# Patient Record
Sex: Male | Born: 1955 | Race: White | Hispanic: No | Marital: Single | Smoking: Current every day smoker
Health system: Southern US, Community
[De-identification: ages and names within clinical notes are randomized; demographics above are authoritative.]

## PROBLEM LIST (undated history)

## (undated) DIAGNOSIS — I219 Acute myocardial infarction, unspecified: Secondary | ICD-10-CM

## (undated) DIAGNOSIS — I10 Essential (primary) hypertension: Secondary | ICD-10-CM

---

## 2006-05-06 HISTORY — PX: CORONARY ANGIOPLASTY WITH STENT PLACEMENT: SHX49

## 2008-02-08 ENCOUNTER — Inpatient Hospital Stay (HOSPITAL_COMMUNITY): Admission: EM | Admit: 2008-02-08 | Discharge: 2008-02-10 | Payer: Self-pay | Admitting: Emergency Medicine

## 2008-02-10 ENCOUNTER — Encounter (INDEPENDENT_AMBULATORY_CARE_PROVIDER_SITE_OTHER): Payer: Self-pay | Admitting: Cardiovascular Disease

## 2010-09-18 NOTE — Discharge Summary (Signed)
NAME:  Jack Foster, Jack Foster NO.:  [id number]   MEDICAL RECORD NO.:  [id number]          PATIENT TYPE:  INP   LOCATION:  3702                         FACILITY:  [facility]   PHYSICIAN:  Nanetta Batty, M.D.   DATE OF BIRTH:  June 21, 1955   DATE OF ADMISSION:  02/08/2008  DATE OF DISCHARGE:  02/10/2008                               DISCHARGE SUMMARY   DISCHARGE DIAGNOSES:  1. Acute non-ST-elevation myocardial infarction.  2. Coronary artery disease with 80% left anterior descending stenosis      undergoing percutaneous transluminal coronary angioplasty and stent      deployment with a Driver stent.  3. Mild decrease in ejection fraction 40% with moderate hypokinesis.  4. Significantly abnormal EKG.  5. Left ventricular hypertrophy on 2D echo.  6. Hypertension, poorly controlled.  7. Hyperlipidemia, now on medication.  8. Occasional bradycardia with beta blocker.  9. Chronic tobacco use, instructed to stop.  10.Hepatitis C, has not had treatment.   DISCHARGE CONDITION:  Improved.   PROCEDURES:  Urgent combined left heart catheterization on February 08, 2008, by Dr. Nanetta Batty.   February 08, 2008, percutaneous transluminal coronary angioplasty and  stent deployment to the left anterior descending stenosis, reducing 80%  to 0% stenosis.   DISCHARGE MEDICATIONS:  Please note, the patient was not on any  medications except p.r.n. Aleve or Pepcid prior to admission.  1. Plavix 75 mg 1 daily, do not stop could cause a heart attack.  2. Aspirin 325 mg daily.  3. Lopressor/metoprolol 50 mg one-half tablet twice a day to equal 25      mg twice a day.  4. Lisinopril 20 mg daily.  5. Simvastatin 40 mg every evening.  6. Zantac 150 mg twice a day for reflux, though previous Pepcid at      home he may complete that and then go to Zantac as it is cheaper.  7. Nitroglycerin 1/150th one under the tongue while sitting as needed      for chest pain, 1 every 5 minutes up to 3 over 15  minutes, if chest      pain continues, call 911.  8. Ativan 0.5 mg 1 every 6 hours as needed for anxiety to help prevent      tobacco use.   Decrease alcohol intake to help protect his stomach and prevent  bleeding.   We will schedule gastroenterologist appointment either in [place] or  [place] to assess hepatitis C.   DISCHARGE INSTRUCTIONS:  1. The patient is currently unemployed.  It was instructed, he may      shower or bathe, increase activity slowly, no lifting for 1 week,      no driving for 2 days.  2. Low-sodium, heart-healthy diet.  3. Wash cath site with soap and water.  Call if any bleeding,      swelling, or drainage.  4. Follow up with Dr. Allyson Sabal, Wednesday, February 17, 2008, at 2:45 p.m.  5. He does not have primary care.  We are referring him to [facility] in [place].  He  should call them when he gets home to set      up an appointment.   Stop using Aleve and prefer Pepcid or Zantac.   HISTORY OF PRESENT ILLNESS:  A 55 year old white divorced male with no  prior cardiac history and only known medical history was for hepatitis C  and complaints of gastroesophageal reflux disease.  The patient was seen  in the emergency room on February 08, 2008, after presenting with chest  pain.  He had had some chest pressure, squeezing, tightness with  diaphoresis, shortness of breath, nausea, and vomiting on that day he  was admitted with abnormal EKG.  He had been given 4 baby aspirin, 1  nitro sublingual with relief.  When he was seen in the emergency room,  he did have EKG changes in anterolateral leads but was pain free.   The patient not had any chest discomfort until Saturday prior to  admission, 1 episode of chest pressure 10-15 minutes, went away with  rest.  On the next day the day prior to admission, 3 episodes of chest  pain pressure, each lasted 15-20 minutes with similar symptoms of  shortness of breath and diaphoresis and that all only occurred in  the  morning and by February 08, 2008, his pain returned, and he realized he  should get some help with it.  Here in the emergency room, his blood  pressure was very elevated as well.   PAST MEDICAL HISTORY:  Untreated hepatitis C.  He had not seen his  physician in some time and known history.   FAMILY HISTORY:  Father and brother in their early 32s with MI.  One  brother with bypass.  Mother does not have coronary disease.  His sister  who was with him in the emergency room is alive and well without  coronary disease.   SOCIAL HISTORY:  Divorced, unemployed Corporate investment banker, and that I do  not believe he drives.  He walks 5 miles at times and rides a bike  locally to get what he needs done.  He does smoke one and a half packs  per day of tobacco and has from numerous years.  He also drinks 1-2, 12  packs a week of beer.  He has also noticed increased weight over the  last several months and has not exercised.  Eats pizza, fried foods,  basically, fast foods.   PHYSICAL EXAMINATION:  At discharge, blood pressure 166/84, pulse 59,  respirations 20, temperature 96.7, and room air oxygen sat 99%.  Blood  blood pressure was fluctuating 129/59 to 166/84.  On exam, alert and  oriented white male.  Lungs were clear without rales, rhonchi, or  wheezes.  Heart sounds S1 and S2, regular rate and rhythm without  murmur, gallop, rub, or click.  Right groin cath site was stable without  hematoma.   LABORATORY DATA:  Hemoglobin 16.4, hematocrit 48.1, WBC 9.1, platelets  167, and neutrophils 76.  These remained stable.  Chemistry, sodium 137,  potassium 5.4 on admission, but it quickly dropped to 3.9, chloride 108,  CO2 23, BUN 11, creatinine 0.84, and glucose 121.  Glucose was mildly  elevated during hospitalization but glycohemoglobin was negative.  Potassium as well as basic metabolic panel remained stable throughout  hospital stay.  Pro time on admission 13, INR of 1.0, PTT 26.  AST on   admission was 58, ALT 51, alkaline phosphatase 58, total bilirubin 0.5,  albumin 3.7.  Follow up prior to discharge,  AST 52, ALT 56, alkaline  phos 69, total bilirubin 1 and albumin 3.9.   Cardiac enzymes, initial CK was 191 with MB of 5.2, troponin I of 0.23.  Second set, CK 162, MB 5.6, and troponin 0.27 and then followup CK 140,  MB 4.4, and troponin 0.13 and prior to discharge troponin was negative,  CK was 169 but MB was 2.   Total cholesterol 217, LDL 157, HDL 36, triglycerides 121.  Calcium was  normal 8.9, magnesium 1.9.  Hepatitis C antibodies reactive.  TSH was  1.441  and glycohemoglobin was 5.1.  Chest x-ray on admission, no acute  infiltrate or pleural effusion or pulmonary edema.  Left basilar  atelectasis noted.   HOSPITAL COURSE:  Mr. Hopping was admitted on February 08, 2008, by Dr.  Allyson Sabal after presenting with chest pain, nausea, vomiting, shortness of  breath, and diaphoresis.  He had abnormal EKG suggesting non-ST  elevation anterolateral MI.  He was taken to the cath lab more urgently  as table was ready in the lab and was found to have an 80% LAD stenosis.  Underwent PTCA and stent deployment.   The patient was originally placed on heparin, Integrilin IV, as well as  IV nitroglycerin.  Blood pressure medications were added for his  uncontrolled hypertension, and he was placed on Ativan 0.5 every 6 hours  during hospital stay secondary to tobacco use and alcohol use.  By the  next day, he was stable without further complaints.  Tobacco Cessation  saw him, and he preferred to stop cold [place].   The patient is unemployed.  He was given prescriptions that he could  have filled at [facility] for 4 dollars and for Plavix, I gave him 2 weeks  of card free and then prescription and we will try and get medications  from the [facility].  By February 10, 2008, he was stable as  cardiac enzymes continued to decrease and it was felt he was ready for  discharge home.  He  ambulated without problems, though his blood  pressure continued to creep up.  Occasionally, his Lopressor had been  held secondary to bradycardia, but he did not complain of any  lightheadedness or dizziness.  We will send him for GI for hepatitis C.  Also the case manager saw him.  We will send him to assess the security  office to apply for Medicaid and for primary care to the [facility] in  [place].  The patient was stable at the time of discharge and will  follow up with Dr. Allyson Sabal as an outpatient.      Darcella Gasman. Annie Paras, N.P.      Nanetta Batty, M.D.  Electronically Signed    LRI/MEDQ  D:  02/10/2008  T:  02/11/2008  Job:  [id number]

## 2010-09-18 NOTE — Cardiovascular Report (Signed)
Jack, Foster NO.:  [id number]   MEDICAL RECORD NO.:  [id number]          PATIENT TYPE:  INP   LOCATION:  2912                         FACILITY:  [facility]   PHYSICIAN:  Nanetta Batty, M.D.   DATE OF BIRTH:  01-11-1956   DATE OF PROCEDURE:  02/08/2008  DATE OF DISCHARGE:                            CARDIAC CATHETERIZATION   Jack Foster is a 55 year old divorced white male father of one,  grandfather of one grandchild with multiple cardiac risk factors  including strong family history and ongoing tobacco abuse, who has had 3  days unstable angina, worse today.  He presented to [facility]  where he was found to have anterolateral T-wave inversion.  His pain  subsided with aspirin and IV nitro.  Bolused with heparin and  Integrilin, was brought to the cardiac catheterization laboratory for  urgent cardiac catheterization to define its anatomy and to rule out an  ischemic etiology.   PROCEDURE DESCRIPTION:  The patient was brought to the second floor  [facility] in a postabsorptive state.  Premedicated with  p.o. Valium, IV Versed and fentanyl.  His right groin was prepped and  shaved in the usual sterile fashion.  A 1% Xylocaine was used for local  anesthesia.  A 6-French sheath was inserted into the right femoral  artery using standard Seldinger technique.  A 6-French right and left  Judkins diagnostic catheter as well as a 6-French pigtail catheter were  used for selective cholangiography, left ventriculography, and distal  abdominal aortography.  Visipaque dye was used for the entirety of the  case.  Retrograde aortic, ventricular, and pull-down pressures were  recorded.   HEMODYNAMICS:  Aortic systolic pressure 120 and diastolic pressure is  62.   Ventricle systolic pressure 124, end-diastolic pressure 6.   SELECTIVE CHOLANGIOGRAPHY:  1. Left main normal.  2. LAD; LAD had a shelf-like lesion of right after the first small  diagonal and septal perforator branch.  The remainder of the LAD is      free of significant disease.  3. Left Circumflex; nondominant and free of significant disease.  4. Right coronary; dominant and free of significant disease.  5. Left ventriculography; RAO ventriculogram was performed using 25 mL      of Visipaque dye at 12 mL per second.  The overall LVEF was      estimated between 40-45% with moderate anteroapical hypokinesia      confirming the proximal LAD lesion significance.  6. Distal abdominal aortography; distal abdominal aortogram was      performed using 20 mL of Visipaque dye at 20 mL per second.  The      renal arteries were widely patent.  The infrarenal abdominal aorta      and iliac bifurcation appear free of significant atherosclerotic      changes.   IMPRESSION:  Mr. Vanatta has what appears to be a significant lesion in  his proximal left anterior descending.  We will proceed with  intravascular ultrasound guided percutaneous coronary intervention and  stenting using Integrin IIB 3 inhibitor and a Medtronic driver bare-  metal stent with intravascular ultrasound guidance.   PROCEDURE DESCRIPTION:  The patient was bolused with 4000 units of  heparin with an ACT of 227.  He received 4 baby aspirin, Plavix 600, and  Integrilin bolus infusion.   Using a 6-French JR-4 guide catheter along with 014 and 190 Asahi soft  wire, and an IVUS catheter intravascular ultrasound interrogation was  performed.  The distal reference measured 3.2 x 3.0 mm.  The lesion  measured 2.37 mm2.   The proximity was direct stented with a 3-0 15-driver stent at 16  atmospheres.  This was postdilated with a 3.25 x 12-[place] sprinter at 14  atmospheres (3.3 mm) resulting reduction of an 80% fairly focal proximal  LAD lesion with 0% residual with TIMI III flow.  The patient tolerated  the procedure well.  Guidewire and catheter were removed.  The sheath  was then secured in place.  The patient  left the lab in stable  condition.  He will be treated with routine medications including beta-  blocker, aspirin, Plavix, statin, and ACE inhibitor.  Cardiac risk  factor modification including smoking cessation will be stressed.   The sheath was then secured in place and the patient left the lab in  stable condition.      Nanetta Batty, M.D.  Electronically Signed     JB/MEDQ  D:  02/08/2008  T:  02/09/2008  Job:  [id number]   cc:   [facility]  [facility] Cardiac Catheterization Lab Second Floor

## 2011-02-05 LAB — BASIC METABOLIC PANEL
BUN: 10
BUN: 11
CO2: 24
Calcium: 8 — ABNORMAL LOW
Calcium: 8.2 — ABNORMAL LOW
Chloride: 105
Chloride: 106
Chloride: 106
Creatinine, Ser: 0.78
Creatinine, Ser: 0.86
GFR calc Af Amer: >60
GFR calc Af Amer: >60
GFR calc non Af Amer: >60
GFR calc non Af Amer: >60
Glucose, Bld: 94
Potassium: 3.9
Potassium: 4.5
Sodium: 136
Sodium: 138

## 2011-02-05 LAB — COMPREHENSIVE METABOLIC PANEL
ALT: 51
Albumin: 3.7
CO2: 23
Calcium: 8.9
Chloride: 108
Creatinine, Ser: 0.84
Glucose, Bld: 121 — ABNORMAL HIGH
Potassium: 5.4 — ABNORMAL HIGH
Sodium: 137
Total Bilirubin: 0.5

## 2011-02-05 LAB — HEPATIC FUNCTION PANEL
ALT: 50
AST: 52 — ABNORMAL HIGH
AST: 54 — ABNORMAL HIGH
Albumin: 3.6
Albumin: 3.9
Total Bilirubin: 1
Total Protein: 7.1
Total Protein: 7.7

## 2011-02-05 LAB — POCT I-STAT, CHEM 8
BUN: 14
Chloride: 108
Glucose, Bld: 124 — ABNORMAL HIGH
Hemoglobin: 17.7 — ABNORMAL HIGH
Sodium: 138

## 2011-02-05 LAB — PLATELET COUNT: Platelets: 160

## 2011-02-05 LAB — CARDIAC PANEL(CRET KIN+CKTOT+MB+TROPI)
CK, MB: 2.6
CK, MB: 4.4 — ABNORMAL HIGH
Relative Index: 3.1 — ABNORMAL HIGH
Troponin I: 0.27 — ABNORMAL HIGH

## 2011-02-05 LAB — DIFFERENTIAL
Basophils Absolute: 0
Basophils Relative: 0
Monocytes Relative: 8
Neutrophils Relative %: 76

## 2011-02-05 LAB — CBC
HCT: 43.7
Hemoglobin: 14.7
Hemoglobin: 15.3
Hemoglobin: 16.4
MCHC: 33.6
MCV: 98.7
MCV: 99.1
RBC: 4.39
RBC: 4.52
RBC: 4.88
RDW: 13.8
RDW: 14.1
RDW: 14.2
WBC: 8.4

## 2011-02-05 LAB — POCT CARDIAC MARKERS
CKMB, poc: 3.4
Myoglobin, poc: 84.6

## 2011-02-05 LAB — HEPATITIS C ANTIBODY: HCV Ab: REACTIVE — AB

## 2011-02-05 LAB — TSH: TSH: 1.441

## 2011-02-05 LAB — APTT: aPTT: 26

## 2011-02-05 LAB — CK TOTAL AND CKMB (NOT AT ARMC): Total CK: 191

## 2011-02-05 LAB — LIPID PANEL: HDL: 36 — ABNORMAL LOW

## 2015-07-01 ENCOUNTER — Emergency Department: Payer: Self-pay

## 2015-07-01 ENCOUNTER — Encounter: Payer: Self-pay | Admitting: Emergency Medicine

## 2015-07-01 ENCOUNTER — Emergency Department
Admission: EM | Admit: 2015-07-01 | Discharge: 2015-07-01 | Disposition: A | Discharge status: Home / Self Care | Payer: Self-pay | Attending: Emergency Medicine | Admitting: Emergency Medicine

## 2015-07-01 DIAGNOSIS — F10120 Alcohol abuse with intoxication, uncomplicated: Secondary | ICD-10-CM | POA: Insufficient documentation

## 2015-07-01 DIAGNOSIS — F10920 Alcohol use, unspecified with intoxication, uncomplicated: Secondary | ICD-10-CM

## 2015-07-01 DIAGNOSIS — F17200 Nicotine dependence, unspecified, uncomplicated: Secondary | ICD-10-CM | POA: Insufficient documentation

## 2015-07-01 DIAGNOSIS — F419 Anxiety disorder, unspecified: Secondary | ICD-10-CM | POA: Insufficient documentation

## 2015-07-01 DIAGNOSIS — R410 Disorientation, unspecified: Secondary | ICD-10-CM

## 2015-07-01 DIAGNOSIS — Z880 Allergy status to penicillin: Secondary | ICD-10-CM | POA: Insufficient documentation

## 2015-07-01 DIAGNOSIS — R002 Palpitations: Secondary | ICD-10-CM | POA: Insufficient documentation

## 2015-07-01 HISTORY — DX: Acute myocardial infarction, unspecified: I21.9

## 2015-07-01 LAB — CBC WITH DIFFERENTIAL/PLATELET
BASOS ABS: 0 10*3/uL (ref 0–0.1)
BASOS PCT: 1 %
EOS ABS: 0.2 10*3/uL (ref 0–0.7)
Eosinophils Relative: 2 %
HEMATOCRIT: 43.4 % (ref 40.0–52.0)
HEMOGLOBIN: 14.9 g/dL (ref 13.0–18.0)
Lymphocytes Relative: 25 %
Lymphs Abs: 2.2 10*3/uL (ref 1.0–3.6)
MCH: 32.1 pg (ref 26.0–34.0)
MCHC: 34.4 g/dL (ref 32.0–36.0)
MCV: 93.4 fL (ref 80.0–100.0)
Monocytes Absolute: 0.8 10*3/uL (ref 0.2–1.0)
Monocytes Relative: 9 %
NEUTROS ABS: 5.5 10*3/uL (ref 1.4–6.5)
NEUTROS PCT: 63 %
Platelets: 144 10*3/uL — ABNORMAL LOW (ref 150–440)
RBC: 4.65 MIL/uL (ref 4.40–5.90)
RDW: 13.6 % (ref 11.5–14.5)
WBC: 8.7 10*3/uL (ref 3.8–10.6)

## 2015-07-01 LAB — COMPREHENSIVE METABOLIC PANEL
ALT: 58 U/L (ref 17–63)
ANION GAP: 8 (ref 5–15)
AST: 54 U/L — ABNORMAL HIGH (ref 15–41)
Albumin: 4.2 g/dL (ref 3.5–5.0)
Alkaline Phosphatase: 72 U/L (ref 38–126)
BUN: 12 mg/dL (ref 6–20)
CHLORIDE: 108 mmol/L (ref 101–111)
CO2: 22 mmol/L (ref 22–32)
CREATININE: 1.01 mg/dL (ref 0.61–1.24)
Calcium: 8.7 mg/dL — ABNORMAL LOW (ref 8.9–10.3)
Glucose, Bld: 98 mg/dL (ref 65–99)
POTASSIUM: 3.8 mmol/L (ref 3.5–5.1)
SODIUM: 138 mmol/L (ref 135–145)
Total Bilirubin: 0.7 mg/dL (ref 0.3–1.2)
Total Protein: 8.1 g/dL (ref 6.5–8.1)

## 2015-07-01 LAB — TROPONIN I: TROPONIN I: 0.03 ng/mL (ref <0.031)

## 2015-07-01 LAB — GLUCOSE, CAPILLARY: GLUCOSE-CAPILLARY: 101 mg/dL — AB (ref 65–99)

## 2015-07-01 LAB — ETHANOL: ALCOHOL ETHYL (B): 102 mg/dL — AB (ref <5)

## 2015-07-01 MED ORDER — FOLIC ACID 1 MG PO TABS
1.0000 mg | ORAL_TABLET | Freq: Once | ORAL | Status: AC
  Administered 2015-07-01: 1 mg via ORAL
  Filled 2015-07-01: qty 1

## 2015-07-01 MED ORDER — VITAMIN B-1 100 MG PO TABS
100.0000 mg | ORAL_TABLET | Freq: Once | ORAL | Status: AC
  Administered 2015-07-01: 100 mg via ORAL
  Filled 2015-07-01: qty 1

## 2015-07-01 NOTE — ED Notes (Signed)
Reports riding his scooter to work this morning and "forgot where I was going".  States he has been confused and jittery this am.  Skin w/d, grips equal, PERRL. Smell of ETOH.

## 2015-07-01 NOTE — ED Notes (Signed)
Pt taken to MRI at this time

## 2015-07-01 NOTE — ED Provider Notes (Signed)
[facility] Emergency Department Provider Note   ____________________________________________  Time seen: Approximately 9:30 AM I have reviewed the triage vital signs and the triage nursing note.  HISTORY  Chief Complaint Altered Mental Status   Historian Patient  HPI Jack Foster is a 60 y.o. male who lives alone, and has a history of prior MI with stents, as well as alcohol use, is here with a complaint of episode of confusion. Patient states she is under a lot of stress especially at work the people aggravate him. He was riding work on his scooter today when he started to feel confused like he didn't know where he was going. He did end up going home. He states at home he couldn't find his shoes but then eventually they were right in front of him. He reports no focal weakness or numbness. No facial droop. No slurred speech. Reportedly a neighbor called EMS for him. No history of prior episode like this before.  Patient states that he often wakes up in the night feeling extremely anxious with palpitations. No headache. No recent trauma.    Past Medical History  Diagnosis Date  . Acute MI (HCC)     There are no active problems to display for this patient.   History reviewed. No pertinent past surgical history.  No current outpatient prescriptions on file.  Allergies Penicillins  History reviewed. No pertinent family history.  Social History Social History  Substance Use Topics  . Smoking status: Current Every Day Smoker  . Smokeless tobacco: None  . Alcohol Use: Yes    Review of Systems  Constitutional: Negative for fever. Eyes: Negative for visual changes. ENT: Negative for sore throat. Cardiovascular: Negative for chest pain. Occasional palpitations Respiratory: Negative for shortness of breath. Gastrointestinal: Negative for abdominal pain, vomiting and diarrhea. Genitourinary: Negative for dysuria. Musculoskeletal: Negative for  back pain. Skin: Negative for rash. Neurological: Negative for headache. 10 point Review of Systems otherwise negative ____________________________________________   PHYSICAL EXAM:  VITAL SIGNS: ED Triage Vitals  Enc Vitals Group     BP 07/01/15 0924 171/73 mmHg     Pulse Rate 07/01/15 0924 56     Resp 07/01/15 0924 20     Temp 07/01/15 0924 98.6 F (37 C)     Temp Source 07/01/15 0924 Oral     SpO2 07/01/15 0924 99 %     Weight 07/01/15 0924 200 lb (90.719 kg)     Height 07/01/15 0924  (1.753 m)     Head Cir --      Peak Flow --      Pain Score 07/01/15 0925 0     Pain Loc --      Pain Edu? --      Excl. in GC? --      Constitutional: Alert and oriented. Well appearing and in no distress. anxious.  HEENT   Head: Normocephalic and atraumatic.      Eyes: Conjunctivae are normal. PERRL. Normal extraocular movements.      Ears:         Nose: No congestion/rhinnorhea.   Mouth/Throat: Mucous membranes are moist.   Neck: No stridor. Cardiovascular/Chest: Normal rate, regular rhythm.  No murmurs, rubs, or gallops. Respiratory: Normal respiratory effort without tachypnea nor retractions. Breath sounds are clear and equal bilaterally. No wheezes/rales/rhonchi. Gastrointestinal: Soft. No distention, no guarding, no rebound. Nontender.   Genitourinary/rectal:Deferred Musculoskeletal: Nontender with normal range of motion in all extremities. No joint effusions.  No lower  extremity tenderness.  No edema. Neurologic: cranial nerves II through X are intact. No facial droop.   Normal speech and language. No gross or focal neurologic deficits are appreciated. coordination intact.  Skin:  Skin is warm, dry and intact. No rash noted. Psychiatric: Mood and affect are normal. Speech and behavior are normal. Patient exhibits appropriate insight and judgment.  ____________________________________________   EKG I, Governor Rooks, MD, the attending physician have personally  viewed and interpreted all ECGs.  54 bpm. Normal sinus rhythm. Narrow QRS. Normal axis. Nonspecific T-wave ____________________________________________  LABS (pertinent positives/negatives)  Competent metabolic panel without significant abnormality Alcohol 102 Troponin 0.03 CBC within normal limits except for platelet 144 ____________________________________________  RADIOLOGY All Xrays were viewed by me. Imaging interpreted by Radiologist.  CT without contrast: No acute endocranial abnormalities  MRI brain without contrast:  IMPRESSION: Motion degraded exam demonstrating no acute intracranial findings.  Premature atrophy with chronic microvascular ischemic change is observed. __________________________________________  PROCEDURES  Procedure(s) performed: None  Critical Care performed: None  ____________________________________________   ED COURSE / ASSESSMENT AND PLAN  Pertinent labs & imaging results that were available during my care of the patient were reviewed by me and considered in my medical decision making (see chart for details).  Patient is overall well-appearing now and has a neuro in tact exam, but states that he had this episode where he felt confused which I think could've been a TIA.  His laboratory evaluation and physical exam are reassuring. His head CT is negative.  I am going to go ahead and send him for an MRI the brain.  MRI negative for acute findings. Patient feels back to baseline and family is with him and states these as baseline. Unclear whether not this could've been a TIA versus alcohol intoxication related. In either case he is well appearing and stable and back to baseline now.   In discussion with the neurologist, he did recommend thiamine 100 mg daily and folate supplementation and follow up with PCP and neurologist. No additional indication for hospitalization and feels that TIA would be pretty unlikely.    CONSULTATIONS:  Phone  consultation with neurologist, Dr. Loretha Brasil  Patient / Family / Caregiver informed of clinical course, medical decision-making process, and agree with plan.   I discussed return precautions, follow-up instructions, and discharged instructions with patient and/or family.   ___________________________________________   FINAL CLINICAL IMPRESSION(S) / ED DIAGNOSES   Final diagnoses:  Alcohol intoxication, uncomplicated (HCC)  Confusion              Note: This dictation was prepared with Dragon dictation. Any transcriptional errors that result from this process are unintentional   Governor Rooks, MD 07/01/15 1520

## 2015-07-01 NOTE — ED Notes (Signed)
Pt returned from CT at this time.  

## 2015-07-01 NOTE — ED Notes (Signed)
MD notified of patient's HR at this time. PT noted to be asleep in the bed with family at bedside. Respirations even and unlabored at this time.

## 2015-07-01 NOTE — Discharge Instructions (Signed)
You were evaluated after an episode of confusion, and although no certain cause was found, and your exam and evaluation are reassuring. After discussion with the neurologist, he recommended supplementation with thiamine 100 mg daily and folate 400 micrograms daily.  You need to be seen in about one week for reevaluation of these over-the-counter supplement doses.  Your episode may have been due to alcohol use, you should limit and decrease over time alcohol use.  Return to the emergency department immediately for any new symptoms or worsening symptoms, especially any vision changes, facial droop, confusion or altered mental status, passing out, weakness or numbness, trouble finding words, or any other symptoms concerning to you.  Out of the possibility for the potential of transient ischemic attack, I do want you to also take 81 mg aspirin daily.   Alcohol Abuse and Nutrition Alcohol abuse is any pattern of alcohol consumption that harms your health, relationships, or work. Alcohol abuse can affect how your body breaks down and absorbs nutrients from food by causing your liver to work abnormally. Additionally, many people who abuse alcohol do not eat enough carbohydrates, protein, fat, vitamins, and minerals. This can cause poor nutrition (malnutrition) and a lack of nutrients (nutrient deficiencies), which can lead to further complications. Nutrients that are commonly lacking (deficient) among people who abuse alcohol include:  Vitamins.  Vitamin A. This is stored in your liver. It is important for your vision, metabolism, and ability to fight off infections (immunity).  B vitamins. These include vitamins such as folate, thiamin, and niacin. These are important in new cell growth and maintenance.  Vitamin C. This plays an important role in iron absorption, wound healing, and immunity.  Vitamin D. This is produced by your liver, but you can also get vitamin D from food. Vitamin D is necessary  for your body to absorb and use calcium.  Minerals.  Calcium. This is important for your bones and your heart and blood vessel (cardiovascular) function.  Iron. This is important for blood, muscle, and nervous system functioning.  Magnesium. This plays an important role in muscle and nerve function, and it helps to control blood sugar and blood pressure.  Zinc. This is important for the normal function of your nervous system and digestive system (gastrointestinal tract). Nutrition is an essential component of therapy for alcohol abuse. Your health care provider or dietitian will work with you to design a plan that can help restore nutrients to your body and prevent potential complications. WHAT IS MY PLAN? Your dietitian may develop a specific diet plan that is based on your condition and any other complications you may have. A diet plan will commonly include:  A balanced diet.  Grains: 6-8 oz per day.  Vegetables: 2-3 cups per day.  Fruits: 1-2 cups per day.  Meat and other protein: 5-6 oz per day.  Dairy: 2-3 cups per day.  Vitamin and mineral supplements. WHAT DO I NEED TO KNOW ABOUT ALCOHOL AND NUTRITION?  Consume foods that are high in antioxidants, such as grapes, berries, nuts, green tea, and dark green and orange vegetables. This can help to counteract some of the stress that is placed on your liver by consuming alcohol.  Avoid food and drinks that are high in fat and sugar. Foods such as sugared soft drinks, salty snack foods, and candy contain empty calories. This means that they lack important nutrients such as protein, fiber, and vitamins.  Eat frequent meals and snacks. Try to eat 5-6 small meals each  day.  Eat a variety of fresh fruits and vegetables each day. This will help you get plenty of water, fiber, and vitamins in your diet.  Drink plenty of water and other clear fluids. Try to drink at least 48-64 oz (1.5-2 L) of water per day.  If you are a vegetarian,  eat a variety of protein-rich foods. Pair whole grains with plant-based proteins at meals and snacks to obtain the greatest nutrient benefit from your food. For example, eat rice with beans, put peanut butter on whole-grain toast, or eat oatmeal with sunflower seeds.  Soak beans and whole grains overnight before cooking. This can help your body to absorb the nutrients more easily.  Include foods fortified with vitamins and minerals in your diet. Commonly fortified foods include milk, orange juice, cereal, and bread.  If you are malnourished, your dietitian may recommend a high-protein, high-calorie diet. This may include:  2,000-3,000 calories (kilocalories) per day.  70-100 grams of protein per day.  Your health care provider may recommend a complete nutritional supplement beverage. This can help to restore calories, protein, and vitamins to your body. Depending on your condition, you may be advised to consume this instead of or in addition to meals.  Limit your intake of caffeine. Replace drinks like coffee and black tea with decaffeinated coffee and herbal tea.  Eat a variety of foods that are high in omega fatty acids. These include fish, nuts and seeds, and soybeans. These foods may help your liver to recover and may also stabilize your mood.  Certain medicines may cause changes in your appetite, taste, and weight. Work with your health care provider and dietitian to make any adjustments to your medicines and diet plan.  Include other healthy lifestyle choices in your daily routine.  Be physically active.  Get enough sleep.  Spend time doing activities that you enjoy.  If you are unable to take in enough food and calories by mouth, your health care provider may recommend a feeding tube. This is a tube that passes through your nose and throat, directly into your stomach. Nutritional supplement beverages can be given to you through the feeding tube to help you get the nutrients you  need.  Take vitamin or mineral supplements as recommended by your health care provider. WHAT FOODS CAN I EAT? Grains Enriched pasta. Enriched rice. Fortified whole-grain bread. Fortified whole-grain cereal. Barley. Brown rice. Quinoa. Millet. Vegetables All fresh, frozen, and canned vegetables. Spinach. Kale. Artichoke. Carrots. Winter squash and pumpkin. Sweet potatoes. Broccoli. Cabbage. Cucumbers. Tomatoes. Sweet peppers. Green beans. Peas. Corn. Fruits All fresh and frozen fruits. Berries. Grapes. Mango. Papaya. Guava. Cherries. Apples. Bananas. Peaches. Plums. Pineapple. Watermelon. Cantaloupe. Oranges. Avocado. Meats and Other Protein Sources Beef liver. Lean beef. Pork. Fresh and canned chicken. Fresh fish. Oysters. Sardines. Canned tuna. Shrimp. Eggs with yolks. Nuts and seeds. Peanut butter. Beans and lentils. Soybeans. Tofu. Dairy Whole, low-fat, and nonfat milk. Whole, low-fat, and nonfat yogurt. Cottage cheese. Sour cream. Hard and soft cheeses. Beverages Water. Herbal tea. Decaffeinated coffee. Decaffeinated green tea. 100% fruit juice. 100% vegetable juice. Instant breakfast shakes. Condiments Ketchup. Mayonnaise. Mustard. Salad dressing. Barbecue sauce. Sweets and Desserts Sugar-free ice cream. Sugar-free pudding. Sugar-free gelatin. Fats and Oils Butter. Vegetable oil, flaxseed oil, olive oil, and walnut oil. Other Complete nutrition shakes. Protein bars. Sugar-free gum. The items listed above may not be a complete list of recommended foods or beverages. Contact your dietitian for more options. WHAT FOODS ARE NOT RECOMMENDED? Grains Sugar-sweetened breakfast  cereals. Flavored instant oatmeal. Fried breads. Vegetables Breaded or deep-fried vegetables. Fruits Dried fruit with added sugar. Candied fruit. Canned fruit in syrup. Meats and Other Protein Sources Breaded or deep-fried meats. Dairy Flavored milks. Fried cheese curds or fried cheese  sticks. Beverages Alcohol. Sugar-sweetened soft drinks. Sugar-sweetened tea. Caffeinated coffee and tea. Condiments Sugar. Honey. Agave nectar. Molasses. Sweets and Desserts Chocolate. Cake. Cookies. Candy. Other Potato chips. Pretzels. Salted nuts. Candied nuts. The items listed above may not be a complete list of foods and beverages to avoid. Contact your dietitian for more information.   This information is not intended to replace advice given to you by your health care provider. Make sure you discuss any questions you have with your health care provider.   Document Released: 02/14/2005 Document Revised: 05/13/2014 Document Reviewed: 11/23/2013 Elsevier Interactive Patient Education 2016 [employer]   Transient Ischemic Attack A transient ischemic attack (TIA) is a "warning stroke" that causes stroke-like symptoms. Unlike a stroke, a TIA does not cause permanent damage to the brain. The symptoms of a TIA can happen very fast and do not last long. It is important to know the symptoms of a TIA and what to do. This can help prevent a major stroke or death. CAUSES  A TIA is caused by a temporary blockage in an artery in the brain or neck (carotid artery). The blockage does not allow the brain to get the blood supply it needs and can cause different symptoms. The blockage can be caused by either:  A blood clot.  Fatty buildup (plaque) in a neck or brain artery. RISK FACTORS  High blood pressure (hypertension).  High cholesterol.  Diabetes mellitus.  Heart disease.  The buildup of plaque in the blood vessels (peripheral artery disease or atherosclerosis).  The buildup of plaque in the blood vessels that provide blood and oxygen to the brain (carotid artery stenosis).  An abnormal heart rhythm (atrial fibrillation).  Obesity.  Using any tobacco products, including cigarettes, chewing tobacco, or electronic cigarettes.  Taking oral contraceptives, especially in combination  with using tobacco.  Physical inactivity.  A diet high in fats, salt (sodium), and calories.  Excessive alcohol use.  Use of illegal drugs (especially cocaine and methamphetamine).  Being male.  Being African American.  Being over the age of 65 years.  Family history of stroke.  Previous history of blood clots, stroke, TIA, or heart attack.  Sickle cell disease. SIGNS AND SYMPTOMS  TIA symptoms are the same as a stroke but are temporary. These symptoms usually develop suddenly, or may be newly present upon waking from sleep:  Sudden weakness or numbness of the face, arm, or leg, especially on one side of the body.  Sudden trouble walking or difficulty moving arms or legs.  Sudden confusion.  Sudden personality changes.  Trouble speaking (aphasia) or understanding.  Difficulty swallowing.  Sudden trouble seeing in one or both eyes.  Double vision.  Dizziness.  Loss of balance or coordination.  Sudden severe headache with no known cause.  Trouble reading or writing.  Loss of bowel or bladder control.  Loss of consciousness. DIAGNOSIS  Your health care provider may be able to determine the presence or absence of a TIA based on your symptoms, history, and physical exam. CT scan of the brain is usually performed to help identify a TIA. Other tests may include:  Electrocardiography (ECG).  Continuous heart monitoring.  Echocardiography.  Carotid ultrasonography.  MRI.  A scan of the brain circulation.  Blood  tests. TREATMENT  Since the symptoms of TIA are the same as a stroke, it is important to seek treatment as soon as possible. You may need a medicine to dissolve a blood clot (thrombolytic) if that is the cause of the TIA. This medicine cannot be given if too much time has passed. Treatment may also include:   Rest, oxygen, fluids through an IV tube, and medicines to thin the blood (anticoagulants).  Measures will be taken to prevent short-term and  long-term complications, including infection from breathing foreign material into the lungs (aspiration pneumonia), blood clots in the legs, and falls.  Procedures to either remove plaque in the carotid arteries or dilate carotid arteries that have narrowed due to plaque. Those procedures are:  Carotid endarterectomy.  Carotid angioplasty and stenting.  Medicines and diet may be used to address diabetes, high blood pressure, and other underlying risk factors. HOME CARE INSTRUCTIONS   Take medicines only as directed by your health care provider. Follow the directions carefully. Medicines may be used to control risk factors for a stroke. Be sure you understand all your medicine instructions.  You may be told to take aspirin or the anticoagulant warfarin. Warfarin needs to be taken exactly as instructed.  Taking too much or too little warfarin is dangerous. Too much warfarin increases the risk of bleeding. Too little warfarin continues to allow the risk for blood clots. While taking warfarin, you will need to have regular blood tests to measure your blood clotting time. A PT blood test measures how long it takes for blood to clot. Your PT is used to calculate another value called an INR. Your PT and INR help your health care provider to adjust your dose of warfarin. The dose can change for many reasons. It is critically important that you take warfarin exactly as prescribed.  Many foods, especially foods high in vitamin K can interfere with warfarin and affect the PT and INR. Foods high in vitamin K include spinach, kale, broccoli, cabbage, collard and turnip greens, Brussels sprouts, peas, cauliflower, seaweed, and parsley, as well as beef and pork liver, green tea, and soybean oil. You should eat a consistent amount of foods high in vitamin K. Avoid major changes in your diet, or notify your health care provider before changing your diet. Arrange a visit with a dietitian to answer your  questions.  Many medicines can interfere with warfarin and affect the PT and INR. You must tell your health care provider about any and all medicines you take; this includes all vitamins and supplements. Be especially cautious with aspirin and anti-inflammatory medicines. Do not take or discontinue any prescribed or over-the-counter medicine except on the advice of your health care provider or pharmacist.  Warfarin can have side effects, such as excessive bruising or bleeding. You will need to hold pressure over cuts for longer than usual. Your health care provider or pharmacist will discuss other potential side effects.  Avoid sports or activities that may cause injury or bleeding.  Be careful when shaving, flossing your teeth, or handling sharp objects.  Alcohol can change the body's ability to handle warfarin. It is best to avoid alcoholic drinks or consume only very small amounts while taking warfarin. Notify your health care provider if you change your alcohol intake.  Notify your dentist or other health care providers before procedures.  Eat a diet that includes 5 or more servings of fruits and vegetables each day. This may reduce the risk of stroke. Certain diets may  be prescribed to address high blood pressure, high cholesterol, diabetes, or obesity.  A diet low in sodium, saturated fat, trans fat, and cholesterol is recommended to manage high blood pressure.  A diet low in saturated fat, trans fat, and cholesterol, and high in fiber may control cholesterol levels.  A controlled-carbohydrate, controlled-sugar diet is recommended to manage diabetes.  A reduced-calorie diet that is low in sodium, saturated fat, trans fat, and cholesterol is recommended to manage obesity.  Maintain a healthy weight.  Stay physically active. It is recommended that you get at least 30 minutes of activity on most or all days.  Do not use any tobacco products, including cigarettes, chewing tobacco, or  electronic cigarettes. If you need help quitting, ask your health care provider.  Limit alcohol intake to no more than 1 drink per day for nonpregnant women and 2 drinks per day for men. One drink equals 12 ounces of beer, 5 ounces of wine, or 1 ounces of hard liquor.  Do not abuse drugs.  A safe home environment is important to reduce the risk of falls. Your health care provider may arrange for specialists to evaluate your home. Having grab bars in the bedroom and bathroom is often important. Your health care provider may arrange for equipment to be used at home, such as raised toilets and a seat for the shower.  Follow all instructions for follow-up with your health care provider. This is very important. This includes any referrals and lab tests. Proper follow-up can prevent a stroke or another TIA from occurring. PREVENTION  The risk of a TIA can be decreased by appropriately treating high blood pressure, high cholesterol, diabetes, heart disease, and obesity, and by quitting smoking, limiting alcohol, and staying physically active. SEEK MEDICAL CARE IF:  You have personality changes.  You have difficulty swallowing.  You are seeing double.  You have dizziness.  You have a fever. SEEK IMMEDIATE MEDICAL CARE IF:  Any of the following symptoms may represent a serious problem that is an emergency. Do not wait to see if the symptoms will go away. Get medical help right away. Call your local emergency services (911 in [place] Do not drive yourself to the hospital.  You have sudden weakness or numbness of the face, arm, or leg, especially on one side of the body.  You have sudden trouble walking or difficulty moving arms or legs.  You have sudden confusion.  You have trouble speaking (aphasia) or understanding.  You have sudden trouble seeing in one or both eyes.  You have a loss of balance or coordination.  You have a sudden, severe headache with no known cause.  You have new  chest pain or an irregular heartbeat.  You have a partial or total loss of consciousness. MAKE SURE YOU:   Understand these instructions.  Will watch your condition.  Will get help right away if you are not doing well or get worse.   This information is not intended to replace advice given to you by your health care provider. Make sure you discuss any questions you have with your health care provider.   Document Released: 01/30/2005 Document Revised: 05/13/2014 Document Reviewed: 07/28/2013 Elsevier Interactive Patient Education [employer]

## 2015-07-01 NOTE — ED Notes (Signed)
Pt resting in bed with family at bedside at this time. Pt resting comfortably. MD notified that pt's daughter and sister would like to see the MD.

## 2015-07-01 NOTE — ED Notes (Signed)
Pt taken to CT at this time.

## 2016-05-08 ENCOUNTER — Emergency Department (HOSPITAL_COMMUNITY): Payer: No Typology Code available for payment source

## 2016-05-08 ENCOUNTER — Emergency Department (HOSPITAL_COMMUNITY)
Admission: EM | Admit: 2016-05-08 | Discharge: 2016-05-08 | Disposition: A | Discharge status: Home / Self Care | Payer: No Typology Code available for payment source | Attending: Emergency Medicine | Admitting: Emergency Medicine

## 2016-05-08 ENCOUNTER — Encounter (HOSPITAL_COMMUNITY): Payer: Self-pay | Admitting: Emergency Medicine

## 2016-05-08 DIAGNOSIS — I10 Essential (primary) hypertension: Secondary | ICD-10-CM | POA: Insufficient documentation

## 2016-05-08 DIAGNOSIS — Y939 Activity, unspecified: Secondary | ICD-10-CM | POA: Diagnosis not present

## 2016-05-08 DIAGNOSIS — Y999 Unspecified external cause status: Secondary | ICD-10-CM | POA: Diagnosis not present

## 2016-05-08 DIAGNOSIS — Z955 Presence of coronary angioplasty implant and graft: Secondary | ICD-10-CM | POA: Insufficient documentation

## 2016-05-08 DIAGNOSIS — I252 Old myocardial infarction: Secondary | ICD-10-CM | POA: Diagnosis not present

## 2016-05-08 DIAGNOSIS — S39012A Strain of muscle, fascia and tendon of lower back, initial encounter: Secondary | ICD-10-CM | POA: Diagnosis not present

## 2016-05-08 DIAGNOSIS — F17200 Nicotine dependence, unspecified, uncomplicated: Secondary | ICD-10-CM | POA: Diagnosis not present

## 2016-05-08 DIAGNOSIS — M25551 Pain in right hip: Secondary | ICD-10-CM | POA: Diagnosis not present

## 2016-05-08 DIAGNOSIS — R1084 Generalized abdominal pain: Secondary | ICD-10-CM | POA: Insufficient documentation

## 2016-05-08 DIAGNOSIS — S0990XA Unspecified injury of head, initial encounter: Secondary | ICD-10-CM | POA: Diagnosis not present

## 2016-05-08 DIAGNOSIS — Y92410 Unspecified street and highway as the place of occurrence of the external cause: Secondary | ICD-10-CM | POA: Insufficient documentation

## 2016-05-08 DIAGNOSIS — R079 Chest pain, unspecified: Secondary | ICD-10-CM | POA: Diagnosis not present

## 2016-05-08 DIAGNOSIS — S3992XA Unspecified injury of lower back, initial encounter: Secondary | ICD-10-CM | POA: Diagnosis present

## 2016-05-08 HISTORY — DX: Essential (primary) hypertension: I10

## 2016-05-08 LAB — I-STAT CHEM 8, ED
BUN: 15 mg/dL (ref 6–20)
CREATININE: 0.8 mg/dL (ref 0.61–1.24)
Calcium, Ion: 1.16 mmol/L (ref 1.15–1.40)
Chloride: 104 mmol/L (ref 101–111)
GLUCOSE: 97 mg/dL (ref 65–99)
HEMATOCRIT: 39 % (ref 39.0–52.0)
Hemoglobin: 13.3 g/dL (ref 13.0–17.0)
Potassium: 4 mmol/L (ref 3.5–5.1)
Sodium: 139 mmol/L (ref 135–145)
TCO2: 26 mmol/L (ref 0–100)

## 2016-05-08 LAB — CBC WITH DIFFERENTIAL/PLATELET
Basophils Absolute: 0 10*3/uL (ref 0.0–0.1)
Basophils Relative: 0 %
EOS PCT: 1 %
Eosinophils Absolute: 0.1 10*3/uL (ref 0.0–0.7)
HEMATOCRIT: 38.4 % — AB (ref 39.0–52.0)
Hemoglobin: 13.5 g/dL (ref 13.0–17.0)
LYMPHS ABS: 1.8 10*3/uL (ref 0.7–4.0)
Lymphocytes Relative: 22 %
MCH: 32.6 pg (ref 26.0–34.0)
MCHC: 35.2 g/dL (ref 30.0–36.0)
MCV: 92.8 fL (ref 78.0–100.0)
MONO ABS: 0.6 10*3/uL (ref 0.1–1.0)
MONOS PCT: 7 %
NEUTROS ABS: 5.9 10*3/uL (ref 1.7–7.7)
Neutrophils Relative %: 70 %
PLATELETS: 134 10*3/uL — AB (ref 150–400)
RBC: 4.14 MIL/uL — ABNORMAL LOW (ref 4.22–5.81)
RDW: 12.5 % (ref 11.5–15.5)
WBC: 8.4 10*3/uL (ref 4.0–10.5)

## 2016-05-08 LAB — COMPREHENSIVE METABOLIC PANEL
ALBUMIN: 3.6 g/dL (ref 3.5–5.0)
ALT: 63 U/L (ref 17–63)
AST: 60 U/L — AB (ref 15–41)
Alkaline Phosphatase: 64 U/L (ref 38–126)
Anion gap: 8 (ref 5–15)
BUN: 12 mg/dL (ref 6–20)
CO2: 23 mmol/L (ref 22–32)
CREATININE: 0.9 mg/dL (ref 0.61–1.24)
Calcium: 9.1 mg/dL (ref 8.9–10.3)
Chloride: 105 mmol/L (ref 101–111)
GFR calc Af Amer: >60 mL/min (ref >60)
GLUCOSE: 98 mg/dL (ref 65–99)
Potassium: 3.9 mmol/L (ref 3.5–5.1)
Sodium: 136 mmol/L (ref 135–145)
Total Bilirubin: 0.7 mg/dL (ref 0.3–1.2)
Total Protein: 7.2 g/dL (ref 6.5–8.1)

## 2016-05-08 LAB — ETHANOL: Alcohol, Ethyl (B): <5 mg/dL (ref <5)

## 2016-05-08 LAB — LIPASE, BLOOD: LIPASE: 26 U/L (ref 11–51)

## 2016-05-08 MED ORDER — HYDROMORPHONE HCL 2 MG/ML IJ SOLN
1.0000 mg | Freq: Once | INTRAMUSCULAR | Status: AC
  Administered 2016-05-08: 1 mg via INTRAVENOUS
  Filled 2016-05-08: qty 1

## 2016-05-08 MED ORDER — SODIUM CHLORIDE 0.9 % IV BOLUS (SEPSIS)
500.0000 mL | Freq: Once | INTRAVENOUS | Status: AC
  Administered 2016-05-08: 500 mL via INTRAVENOUS

## 2016-05-08 MED ORDER — HYDROCODONE-ACETAMINOPHEN 5-325 MG PO TABS: 1.0000 | ORAL_TABLET | Freq: Four times a day (QID) | ORAL | 0 refills | Status: AC | PRN

## 2016-05-08 MED ORDER — IOPAMIDOL (ISOVUE-300) INJECTION 61%
INTRAVENOUS | Status: AC
  Administered 2016-05-08: 100 mL
  Filled 2016-05-08: qty 100

## 2016-05-08 MED ORDER — SODIUM CHLORIDE 0.9 % IV SOLN: INTRAVENOUS | Status: DC

## 2016-05-08 MED ORDER — ONDANSETRON HCL 4 MG/2ML IJ SOLN
4.0000 mg | Freq: Once | INTRAMUSCULAR | Status: DC
  Filled 2016-05-08: qty 2
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
## 2016-05-08 NOTE — ED Notes (Signed)
Patient verbalized understanding of discharge instructions and denies any further needs or questions at this time. VS stable. Patient ambulatory with steady gait, escorted to ED entrance in wheelchair.   
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
## 2016-05-12 ENCOUNTER — Telehealth: Payer: Self-pay | Admitting: *Deleted
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
## 2016-05-13 ENCOUNTER — Encounter: Payer: Self-pay | Admitting: Emergency Medicine

## 2016-05-13 ENCOUNTER — Emergency Department
Admission: EM | Admit: 2016-05-13 | Discharge: 2016-05-13 | Disposition: A | Discharge status: Home / Self Care | Payer: No Typology Code available for payment source | Attending: Emergency Medicine | Admitting: Emergency Medicine

## 2016-05-13 ENCOUNTER — Emergency Department: Payer: No Typology Code available for payment source

## 2016-05-13 DIAGNOSIS — I10 Essential (primary) hypertension: Secondary | ICD-10-CM | POA: Insufficient documentation

## 2016-05-13 DIAGNOSIS — F17200 Nicotine dependence, unspecified, uncomplicated: Secondary | ICD-10-CM | POA: Insufficient documentation

## 2016-05-13 DIAGNOSIS — M7631 Iliotibial band syndrome, right leg: Secondary | ICD-10-CM | POA: Diagnosis not present

## 2016-05-13 DIAGNOSIS — M25551 Pain in right hip: Secondary | ICD-10-CM | POA: Diagnosis present

## 2016-05-13 MED ORDER — DICLOFENAC SODIUM 75 MG PO TBEC: 75.0000 mg | DELAYED_RELEASE_TABLET | Freq: Two times a day (BID) | ORAL | 0 refills | Status: AC

## 2016-05-13 MED ORDER — CYCLOBENZAPRINE HCL 5 MG PO TABS: 5.0000 mg | ORAL_TABLET | Freq: Three times a day (TID) | ORAL | 0 refills | Status: AC | PRN

## 2016-05-13 MED ORDER — ONDANSETRON 4 MG PO TBDP: 4.0000 mg | ORAL_TABLET | Freq: Four times a day (QID) | ORAL | 0 refills | Status: AC | PRN

## 2016-05-13 NOTE — Discharge Instructions (Signed)
Take the prescription meds as directed. Apply ice reduce pain and swelling. Follow-up [facility] for continued symptoms.
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
## 2018-01-04 DEATH — deceased
